# Patient Record
Sex: Female | Born: 1965 | Race: White | Hispanic: No | Marital: Married | Smoking: Never smoker
Health system: Southern US, Community
[De-identification: ages and names within clinical notes are randomized; demographics above are authoritative.]

## PROBLEM LIST (undated history)

## (undated) DIAGNOSIS — C439 Malignant melanoma of skin, unspecified: Secondary | ICD-10-CM

## (undated) DIAGNOSIS — M329 Systemic lupus erythematosus, unspecified: Secondary | ICD-10-CM

## (undated) DIAGNOSIS — C801 Malignant (primary) neoplasm, unspecified: Secondary | ICD-10-CM

## (undated) DIAGNOSIS — D6862 Lupus anticoagulant syndrome: Secondary | ICD-10-CM

## (undated) DIAGNOSIS — M3214 Glomerular disease in systemic lupus erythematosus: Secondary | ICD-10-CM

## (undated) HISTORY — PX: TONSILLECTOMY: SUR1361

---

## 2007-05-21 ENCOUNTER — Encounter: Admission: RE | Admit: 2007-05-21 | Discharge: 2007-05-21 | Payer: Self-pay | Admitting: Obstetrics and Gynecology

## 2008-06-20 ENCOUNTER — Ambulatory Visit: Payer: Self-pay | Admitting: Diagnostic Radiology

## 2008-06-20 ENCOUNTER — Ambulatory Visit (HOSPITAL_BASED_OUTPATIENT_CLINIC_OR_DEPARTMENT_OTHER): Admission: RE | Admit: 2008-06-20 | Discharge: 2008-06-20 | Payer: Self-pay | Admitting: Obstetrics and Gynecology

## 2009-08-12 ENCOUNTER — Ambulatory Visit: Payer: Self-pay | Admitting: Diagnostic Radiology

## 2009-08-12 ENCOUNTER — Ambulatory Visit (HOSPITAL_BASED_OUTPATIENT_CLINIC_OR_DEPARTMENT_OTHER): Admission: RE | Admit: 2009-08-12 | Discharge: 2009-08-12 | Payer: Self-pay | Admitting: Obstetrics and Gynecology

## 2009-08-19 ENCOUNTER — Encounter: Admission: RE | Admit: 2009-08-19 | Discharge: 2009-08-19 | Payer: Self-pay | Admitting: Obstetrics and Gynecology

## 2010-04-18 ENCOUNTER — Encounter: Payer: Self-pay | Admitting: Obstetrics and Gynecology

## 2010-09-10 ENCOUNTER — Other Ambulatory Visit (HOSPITAL_BASED_OUTPATIENT_CLINIC_OR_DEPARTMENT_OTHER): Payer: Self-pay | Admitting: Obstetrics and Gynecology

## 2010-09-10 DIAGNOSIS — Z1231 Encounter for screening mammogram for malignant neoplasm of breast: Secondary | ICD-10-CM

## 2010-09-13 ENCOUNTER — Ambulatory Visit (HOSPITAL_BASED_OUTPATIENT_CLINIC_OR_DEPARTMENT_OTHER)
Admission: RE | Admit: 2010-09-13 | Discharge: 2010-09-13 | Disposition: A | Discharge status: Home / Self Care | Payer: BC Managed Care – PPO | Source: Ambulatory Visit | Attending: Obstetrics and Gynecology | Admitting: Obstetrics and Gynecology

## 2010-09-13 DIAGNOSIS — Z1231 Encounter for screening mammogram for malignant neoplasm of breast: Secondary | ICD-10-CM | POA: Insufficient documentation

## 2011-10-04 ENCOUNTER — Other Ambulatory Visit (HOSPITAL_BASED_OUTPATIENT_CLINIC_OR_DEPARTMENT_OTHER): Payer: Self-pay | Admitting: Obstetrics and Gynecology

## 2011-10-04 DIAGNOSIS — Z1231 Encounter for screening mammogram for malignant neoplasm of breast: Secondary | ICD-10-CM

## 2011-10-05 ENCOUNTER — Ambulatory Visit (HOSPITAL_BASED_OUTPATIENT_CLINIC_OR_DEPARTMENT_OTHER)
Admission: RE | Admit: 2011-10-05 | Discharge: 2011-10-05 | Disposition: A | Discharge status: Home / Self Care | Payer: BC Managed Care – PPO | Source: Ambulatory Visit | Attending: Obstetrics and Gynecology | Admitting: Obstetrics and Gynecology

## 2011-10-05 DIAGNOSIS — Z1231 Encounter for screening mammogram for malignant neoplasm of breast: Secondary | ICD-10-CM | POA: Insufficient documentation

## 2012-10-23 ENCOUNTER — Other Ambulatory Visit (HOSPITAL_BASED_OUTPATIENT_CLINIC_OR_DEPARTMENT_OTHER): Payer: Self-pay | Admitting: Obstetrics and Gynecology

## 2012-10-23 DIAGNOSIS — Z1231 Encounter for screening mammogram for malignant neoplasm of breast: Secondary | ICD-10-CM

## 2012-10-31 ENCOUNTER — Ambulatory Visit (HOSPITAL_BASED_OUTPATIENT_CLINIC_OR_DEPARTMENT_OTHER)
Admission: RE | Admit: 2012-10-31 | Discharge: 2012-10-31 | Disposition: A | Discharge status: Home / Self Care | Payer: BC Managed Care – PPO | Source: Ambulatory Visit | Attending: Obstetrics and Gynecology | Admitting: Obstetrics and Gynecology

## 2012-10-31 DIAGNOSIS — Z1231 Encounter for screening mammogram for malignant neoplasm of breast: Secondary | ICD-10-CM | POA: Insufficient documentation

## 2014-04-07 ENCOUNTER — Other Ambulatory Visit (HOSPITAL_BASED_OUTPATIENT_CLINIC_OR_DEPARTMENT_OTHER): Payer: Self-pay | Admitting: Obstetrics and Gynecology

## 2014-04-07 DIAGNOSIS — Z1231 Encounter for screening mammogram for malignant neoplasm of breast: Secondary | ICD-10-CM

## 2014-04-08 ENCOUNTER — Ambulatory Visit (HOSPITAL_BASED_OUTPATIENT_CLINIC_OR_DEPARTMENT_OTHER)
Admission: RE | Admit: 2014-04-08 | Discharge: 2014-04-08 | Disposition: A | Discharge status: Home / Self Care | Payer: 59 | Source: Ambulatory Visit | Attending: Obstetrics and Gynecology | Admitting: Obstetrics and Gynecology

## 2014-04-08 DIAGNOSIS — Z1231 Encounter for screening mammogram for malignant neoplasm of breast: Secondary | ICD-10-CM | POA: Diagnosis present

## 2014-06-14 ENCOUNTER — Encounter (HOSPITAL_BASED_OUTPATIENT_CLINIC_OR_DEPARTMENT_OTHER): Payer: Self-pay | Admitting: *Deleted

## 2014-06-14 ENCOUNTER — Emergency Department (HOSPITAL_BASED_OUTPATIENT_CLINIC_OR_DEPARTMENT_OTHER)
Admission: EM | Admit: 2014-06-14 | Discharge: 2014-06-14 | Disposition: A | Discharge status: Home / Self Care | Payer: 59 | Attending: Emergency Medicine | Admitting: Emergency Medicine

## 2014-06-14 ENCOUNTER — Emergency Department (HOSPITAL_BASED_OUTPATIENT_CLINIC_OR_DEPARTMENT_OTHER): Payer: 59

## 2014-06-14 DIAGNOSIS — R1032 Left lower quadrant pain: Secondary | ICD-10-CM | POA: Insufficient documentation

## 2014-06-14 DIAGNOSIS — Z7901 Long term (current) use of anticoagulants: Secondary | ICD-10-CM | POA: Insufficient documentation

## 2014-06-14 DIAGNOSIS — Z85828 Personal history of other malignant neoplasm of skin: Secondary | ICD-10-CM | POA: Diagnosis not present

## 2014-06-14 DIAGNOSIS — Z79899 Other long term (current) drug therapy: Secondary | ICD-10-CM | POA: Diagnosis not present

## 2014-06-14 DIAGNOSIS — Z8739 Personal history of other diseases of the musculoskeletal system and connective tissue: Secondary | ICD-10-CM | POA: Diagnosis not present

## 2014-06-14 DIAGNOSIS — Z3202 Encounter for pregnancy test, result negative: Secondary | ICD-10-CM | POA: Insufficient documentation

## 2014-06-14 DIAGNOSIS — R109 Unspecified abdominal pain: Secondary | ICD-10-CM

## 2014-06-14 DIAGNOSIS — Z862 Personal history of diseases of the blood and blood-forming organs and certain disorders involving the immune mechanism: Secondary | ICD-10-CM | POA: Diagnosis not present

## 2014-06-14 HISTORY — DX: Lupus anticoagulant syndrome: D68.62

## 2014-06-14 HISTORY — DX: Malignant (primary) neoplasm, unspecified: C80.1

## 2014-06-14 HISTORY — DX: Systemic lupus erythematosus, unspecified: M32.9

## 2014-06-14 HISTORY — DX: Glomerular disease in systemic lupus erythematosus: M32.14

## 2014-06-14 HISTORY — DX: Malignant melanoma of skin, unspecified: C43.9

## 2014-06-14 LAB — URINALYSIS, ROUTINE W REFLEX MICROSCOPIC
Bilirubin Urine: NEGATIVE
GLUCOSE, UA: NEGATIVE mg/dL
Hgb urine dipstick: NEGATIVE
KETONES UR: NEGATIVE mg/dL
Leukocytes, UA: NEGATIVE
Nitrite: NEGATIVE
Protein, ur: 30 mg/dL — AB
SPECIFIC GRAVITY, URINE: 1.02 (ref 1.005–1.030)
Urobilinogen, UA: 1 mg/dL (ref 0.0–1.0)
pH: 7.5 (ref 5.0–8.0)

## 2014-06-14 LAB — URINE MICROSCOPIC-ADD ON

## 2014-06-14 LAB — CBC WITH DIFFERENTIAL/PLATELET
Basophils Absolute: 0 10*3/uL (ref 0.0–0.1)
Basophils Relative: 0 % (ref 0–1)
Eosinophils Absolute: 0.3 10*3/uL (ref 0.0–0.7)
Eosinophils Relative: 3 % (ref 0–5)
HCT: 45.7 % (ref 36.0–46.0)
Hemoglobin: 15.3 g/dL — ABNORMAL HIGH (ref 12.0–15.0)
Lymphocytes Relative: 27 % (ref 12–46)
Lymphs Abs: 2.6 10*3/uL (ref 0.7–4.0)
MCH: 30.3 pg (ref 26.0–34.0)
MCHC: 33.5 g/dL (ref 30.0–36.0)
MCV: 90.5 fL (ref 78.0–100.0)
Monocytes Absolute: 0.7 10*3/uL (ref 0.1–1.0)
Monocytes Relative: 7 % (ref 3–12)
Neutro Abs: 6.1 10*3/uL (ref 1.7–7.7)
Neutrophils Relative %: 63 % (ref 43–77)
Platelets: 231 10*3/uL (ref 150–400)
RBC: 5.05 MIL/uL (ref 3.87–5.11)
RDW: 13 % (ref 11.5–15.5)
WBC: 9.6 10*3/uL (ref 4.0–10.5)

## 2014-06-14 LAB — PROTIME-INR
INR: 1.81 — AB (ref 0.00–1.49)
Prothrombin Time: 21 seconds — ABNORMAL HIGH (ref 11.6–15.2)

## 2014-06-14 LAB — BASIC METABOLIC PANEL
ANION GAP: 9 (ref 5–15)
BUN: 14 mg/dL (ref 6–23)
CHLORIDE: 102 mmol/L (ref 96–112)
CO2: 26 mmol/L (ref 19–32)
Calcium: 9.5 mg/dL (ref 8.4–10.5)
Creatinine, Ser: 0.68 mg/dL (ref 0.50–1.10)
GFR calc Af Amer: >90 mL/min (ref >90)
Glucose, Bld: 160 mg/dL — ABNORMAL HIGH (ref 70–99)
Potassium: 4.1 mmol/L (ref 3.5–5.1)
SODIUM: 137 mmol/L (ref 135–145)

## 2014-06-14 LAB — PREGNANCY, URINE: PREG TEST UR: NEGATIVE

## 2014-06-14 MED ORDER — HYDROCODONE-ACETAMINOPHEN 5-325 MG PO TABS: 1.0000 | ORAL_TABLET | Freq: Four times a day (QID) | ORAL | Status: AC | PRN

## 2014-06-14 MED ORDER — ONDANSETRON HCL 4 MG/2ML IJ SOLN
INTRAMUSCULAR | Status: AC
Start: 2014-06-14 — End: 2014-06-14
  Filled 2014-06-14: qty 2

## 2014-06-14 MED ORDER — IOHEXOL 300 MG/ML  SOLN
100.0000 mL | Freq: Once | INTRAMUSCULAR | Status: AC | PRN
  Administered 2014-06-14: 100 mL via INTRAVENOUS

## 2014-06-14 MED ORDER — SODIUM CHLORIDE 0.9 % IV BOLUS (SEPSIS)
1000.0000 mL | Freq: Once | INTRAVENOUS | Status: AC
  Administered 2014-06-14: 1000 mL via INTRAVENOUS

## 2014-06-14 MED ORDER — IOHEXOL 300 MG/ML  SOLN
50.0000 mL | Freq: Once | INTRAMUSCULAR | Status: AC | PRN
  Administered 2014-06-14: 50 mL via ORAL

## 2014-06-14 MED ORDER — ONDANSETRON 4 MG PO TBDP: 4.0000 mg | ORAL_TABLET | Freq: Three times a day (TID) | ORAL | Status: AC | PRN

## 2014-06-14 MED ORDER — MORPHINE SULFATE 4 MG/ML IJ SOLN
4.0000 mg | Freq: Once | INTRAMUSCULAR | Status: AC
  Administered 2014-06-14: 4 mg via INTRAVENOUS
  Filled 2014-06-14: qty 1

## 2014-06-14 MED ORDER — ONDANSETRON HCL 4 MG/2ML IJ SOLN
4.0000 mg | Freq: Once | INTRAMUSCULAR | Status: AC
  Administered 2014-06-14: 4 mg via INTRAVENOUS

## 2014-06-14 NOTE — ED Notes (Signed)
Hip and LLQ abd pain since 06/02/14- Seen by her GYN yesterday and full exam and [place]- scheduled for CT scan on Tuesday- states pain is now radiating into back and hip- ambulates with limp- statespain too great to wait for CT Tuesday- denies n/v/d

## 2014-06-14 NOTE — ED Provider Notes (Signed)
CSN: [id number]     Arrival date & time 06/14/14  1135 History   First MD Initiated Contact with Patient 06/14/14 1205     Chief Complaint  Patient presents with  . Flank Pain     (Consider location/radiation/quality/duration/timing/severity/associated sxs/prior Treatment) HPI Comments: Patient is a 49 yo F PMHx significant for SLE, Lupus nephritis, Lupus anticoagulant disorder presenting to the ED for evaluation of worsening left flank pain with radiation to LLQ over the last day since being seen by her Ob/Gyn yesterday. Patient states that her pain initially started on the seventh. She states she had a pelvic ultrasound and pelvic exam which revealed small fibroids, she was due to have a CT scan of her abdomen with contrast on Tuesday the due to her increased pain and inability to get comfortable or sleep she came to the emergency department for sooner evaluation. No modifying factors identified. Denies any fevers, chills, nausea, vomiting, chest pain, shortness of breath, urinary or bowel concerns.  Patient is a 49 y.o. female presenting with flank pain.  Flank Pain Associated symptoms include abdominal pain. Pertinent negatives include no nausea or vomiting.    Past Medical History  Diagnosis Date  . Cancer   . Melanoma   . SLE (systemic lupus erythematosus)   . Lupus anticoagulant disorder   . Lupus nephritis    Past Surgical History  Procedure Laterality Date  . Tonsillectomy     No family history on file. History  Substance Use Topics  . Smoking status: Never Smoker   . Smokeless tobacco: Never Used  . Alcohol Use: No   OB History    No data available     Review of Systems  Gastrointestinal: Positive for abdominal pain. Negative for nausea and vomiting.  Genitourinary: Positive for flank pain. Negative for decreased urine volume, vaginal bleeding, vaginal discharge and vaginal pain.  All other systems reviewed and are negative.     Allergies  Review of  patient's allergies indicates no known allergies.  Home Medications   Prior to Admission medications   Medication Sig Start Date End Date Taking? Authorizing Provider  citalopram (CELEXA) 10 MG tablet Take 10 mg by mouth daily.   Yes Historical Provider, MD  fexofenadine (ALLEGRA) 180 MG tablet Take 180 mg by mouth daily.   Yes Historical Provider, MD  lisinopril (PRINIVIL,ZESTRIL) 2.5 MG tablet Take 2.5 mg by mouth daily. For kidneys   Yes Historical Provider, MD  warfarin (COUMADIN) 4 MG tablet Take 4 mg by mouth daily. 4mg  Mon- Sat; 6mg  on sunday   Yes Historical Provider, MD  HYDROcodone-acetaminophen (NORCO) 5-325 MG per tablet Take 1-2 tablets by mouth every 6 (six) hours as needed for severe pain. 06/14/14   Markelle Asaro, PA-C  ondansetron (ZOFRAN ODT) 4 MG disintegrating tablet Take 1 tablet (4 mg total) by mouth every 8 (eight) hours as needed for nausea. 06/14/14   Zoelle Markus, PA-C   BP 112/61 mmHg  Pulse 69  Temp(Src) 97.7 F (36.5 C) (Oral)  Resp 18  Ht 5\' 6"  (1.676 m)  Wt 201 lb (91.173 kg)  BMI 32.46 kg/m2  SpO2 100% Physical Exam  Constitutional: She is oriented to person, place, and time. She appears well-developed and well-nourished. No distress.  Patient pacing uncomfortably in the room.   HENT:  Head: Normocephalic and atraumatic.  Right Ear: External ear normal.  Left Ear: External ear normal.  Nose: Nose normal.  Mouth/Throat: Oropharynx is clear and moist.  Eyes: Conjunctivae are normal.  Neck: Normal range of motion. Neck supple.  Cardiovascular: Normal rate, regular rhythm and normal heart sounds.   Pulmonary/Chest: Effort normal and breath sounds normal.  Abdominal: Soft. Bowel sounds are normal. She exhibits no distension. There is tenderness in the left lower quadrant. There is no rigidity, no rebound and no guarding.    Musculoskeletal: Normal range of motion.       Back:  Neurological: She is alert and oriented to person, place,  and time.  Skin: Skin is warm and dry. She is not diaphoretic.  Psychiatric: She has a normal mood and affect.  Nursing note and vitals reviewed.   ED Course  Procedures (including critical care time) Medications  morphine 4 MG/ML injection 4 mg (4 mg Intravenous Given 06/14/14 1302)  sodium chloride 0.9 % bolus 1,000 mL (0 mLs Intravenous Stopped 06/14/14 1339)  iohexol (OMNIPAQUE) 300 MG/ML solution 50 mL (50 mLs Oral Contrast Given 06/14/14 1255)  ondansetron (ZOFRAN) injection 4 mg (4 mg Intravenous Given 06/14/14 1335)  ondansetron (ZOFRAN) 4 MG/2ML injection (  Duplicate 0/25/85 2778)  iohexol (OMNIPAQUE) 300 MG/ML solution 100 mL (100 mLs Intravenous Contrast Given 06/14/14 1350)    Labs Review Labs Reviewed  URINALYSIS, ROUTINE W REFLEX MICROSCOPIC - Abnormal; Notable for the following:    APPearance CLOUDY (*)    Protein, ur 30 (*)    All other components within normal limits  URINE MICROSCOPIC-ADD ON - Abnormal; Notable for the following:    Squamous Epithelial / LPF FEW (*)    Bacteria, UA MANY (*)    All other components within normal limits  BASIC METABOLIC PANEL - Abnormal; Notable for the following:    Glucose, Bld 160 (*)    All other components within normal limits  CBC WITH DIFFERENTIAL/PLATELET - Abnormal; Notable for the following:    Hemoglobin 15.3 (*)    All other components within normal limits  PROTIME-INR - Abnormal; Notable for the following:    Prothrombin Time 21.0 (*)    INR 1.81 (*)    All other components within normal limits  PREGNANCY, URINE    Imaging Review Ct Abdomen Pelvis W Contrast  06/14/2014   CLINICAL DATA:  Lt groin, lt lower abd pain radiating around to back since march 7. Hx ivc filter 9yrs ago for pe.  EXAM: CT ABDOMEN AND PELVIS WITH CONTRAST  TECHNIQUE: Multidetector CT imaging of the abdomen and pelvis was performed using the standard protocol following bolus administration of intravenous contrast.  CONTRAST:  12mL OMNIPAQUE  IOHEXOL 300 MG/ML SOLN, 58mL OMNIPAQUE IOHEXOL 300 MG/ML SOLN  COMPARISON:  Abdominal ultrasound, 01/27/2014  FINDINGS: Coarse reticular and mild patchy opacity in the dependent lower lobes, left greater than right, most likely atelectasis. Heart is normal size.  Liver is borderline enlarged. There is diffuse fatty infiltration. No liver mass or focal lesion.  Normal spleen, gallbladder, pancreas and adrenal glands.  13 mm midpole left renal cyst. No other renal masses, no stones and no hydronephrosis. Normal ureters and bladder.  Uterus and adnexa are unremarkable.  No pathologically enlarged lymph nodes. No abnormal fluid collections. Inferior vena cava filter is well positioned with its superior tip just below the right renal artery.  There are scattered colonic diverticula mostly along the sigmoid. No diverticulitis. No other colonic abnormality. Normal appendix and small bowel.  No significant bony abnormality.  IMPRESSION: 1. No acute findings. No findings to explain left lower abdomen to left groin pain. 2. There are colonic diverticula mostly along sigmoid  without evidence diverticulitis. 3. Borderline hepatomegaly.  Diffuse hepatic steatosis.   Electronically Signed   By: Lajean Manes M.D.   On: 06/14/2014 14:18     EKG Interpretation None      MDM   Final diagnoses:  Abdominal pain in female  Abdominal pain in female    Filed Vitals:   06/14/14 1433  BP: 112/61  Pulse: 69  Temp: 97.7 F (36.5 C)  Resp: 18   Afebrile, NAD, non-toxic appearing, AAOx4.  I have reviewed nursing notes, vital signs, and all appropriate lab and imaging results for this patient. Patient is nontoxic, nonseptic appearing, in no apparent distress.  Patient's pain and other symptoms adequately managed in emergency department.  Fluid bolus given.  Labs, imaging and vitals reviewed.  Patient does not meet the SIRS or Sepsis criteria.  On repeat exam patient does not have a surgical abdomin and there are no  peritoneal signs.  No indication of appendicitis, bowel obstruction, bowel perforation, cholecystitis, diverticulitis, PID or ectopic pregnancy.  Patient discharged home with symptomatic treatment and given strict instructions for follow-up with their primary care physician.  I have also discussed reasons to return immediately to the ER.  Patient expresses understanding and agrees with plan. Patient is stable at time of discharge     Baron Sane, PA-C 06/14/14 [place], MD 06/14/14 2039

## 2014-06-14 NOTE — ED Notes (Signed)
Pt states "nagging throbbing" suprapubic/lower abdominal pain (bilateral) since march 7.  Pt seen by obgyn yesterday and had vaginal and external ultrasound performed, with resulting dx of "very small fibroids."  Per pt, obgyn stated that fibroids not likely cause of her pain and that she follow up with a CT scan, scheduled for Tuesday.  Past 2 night, pt unable to get comfortable to sleep.  Pt denies any urinary or bowel concerns or symptoms, but states that pain now radiates from suprapubic region around to bilateral flanks, worse on left side.

## 2014-06-14 NOTE — Discharge Instructions (Signed)
Please follow up with your primary care physician in 1-2 days. If you do not have one please call the [place] number listed above. Please take pain medication and/or muscle relaxants as prescribed and as needed for pain. Please do not drive on narcotic pain medication or on muscle relaxants. Please read all discharge instructions and return precautions.    Abdominal Pain, Women Abdominal (stomach, pelvic, or belly) pain can be caused by many things. It is important to tell your doctor:  The location of the pain.  Does it come and go or is it present all the time?  Are there things that start the pain (eating certain foods, exercise)?  Are there other symptoms associated with the pain (fever, nausea, vomiting, diarrhea)? All of this is helpful to know when trying to find the cause of the pain. CAUSES   Stomach: virus or bacteria infection, or ulcer.  Intestine: appendicitis (inflamed appendix), regional ileitis (Crohn's disease), ulcerative colitis (inflamed colon), irritable bowel syndrome, diverticulitis (inflamed diverticulum of the colon), or cancer of the stomach or intestine.  Gallbladder disease or stones in the gallbladder.  Kidney disease, kidney stones, or infection.  Pancreas infection or cancer.  Fibromyalgia (pain disorder).  Diseases of the female organs:  Uterus: fibroid (non-cancerous) tumors or infection.  Fallopian tubes: infection or tubal pregnancy.  Ovary: cysts or tumors.  Pelvic adhesions (scar tissue).  Endometriosis (uterus lining tissue growing in the pelvis and on the pelvic organs).  Pelvic congestion syndrome (female organs filling up with blood just before the menstrual period).  Pain with the menstrual period.  Pain with ovulation (producing an egg).  Pain with an IUD (intrauterine device, birth control) in the uterus.  Cancer of the female organs.  Functional pain (pain not caused by a disease, may improve  without treatment).  Psychological pain.  Depression. DIAGNOSIS  Your doctor will decide the seriousness of your pain by doing an examination.  Blood tests.  X-rays.  Ultrasound.  CT scan (computed tomography, special type of X-ray).  MRI (magnetic resonance imaging).  Cultures, for infection.  Barium enema (dye inserted in the large intestine, to better view it with X-rays).  Colonoscopy (looking in intestine with a lighted tube).  Laparoscopy (minor surgery, looking in abdomen with a lighted tube).  Major abdominal exploratory surgery (looking in abdomen with a large incision). TREATMENT  The treatment will depend on the cause of the pain.   Many cases can be observed and treated at home.  Over-the-counter medicines recommended by your caregiver.  Prescription medicine.  Antibiotics, for infection.  Birth control pills, for painful periods or for ovulation pain.  Hormone treatment, for endometriosis.  Nerve blocking injections.  Physical therapy.  Antidepressants.  Counseling with a psychologist or psychiatrist.  Minor or major surgery. HOME CARE INSTRUCTIONS   Do not take laxatives, unless directed by your caregiver.  Take over-the-counter pain medicine only if ordered by your caregiver. Do not take aspirin because it can cause an upset stomach or bleeding.  Try a clear liquid diet (broth or water) as ordered by your caregiver. Slowly move to a bland diet, as tolerated, if the pain is related to the stomach or intestine.  Have a thermometer and take your temperature several times a day, and record it.  Bed rest and sleep, if it helps the pain.  Avoid sexual intercourse, if it causes pain.  Avoid stressful situations.  Keep your follow-up appointments and tests, as your caregiver orders.  If  the pain does not go away with medicine or surgery, you may try:  Acupuncture.  Relaxation exercises (yoga, meditation).  Group  therapy.  Counseling. SEEK MEDICAL CARE IF:   You notice certain foods cause stomach pain.  Your home care treatment is not helping your pain.  You need stronger pain medicine.  You want your IUD removed.  You feel faint or lightheaded.  You develop nausea and vomiting.  You develop a rash.  You are having side effects or an allergy to your medicine. SEEK IMMEDIATE MEDICAL CARE IF:   Your pain does not go away or gets worse.  You have a fever.  Your pain is felt only in portions of the abdomen. The right side could possibly be appendicitis. The left lower portion of the abdomen could be colitis or diverticulitis.  You are passing blood in your stools (bright red or black tarry stools, with or without vomiting).  You have blood in your urine.  You develop chills, with or without a fever.  You pass out. MAKE SURE YOU:   Understand these instructions.  Will watch your condition.  Will get help right away if you are not doing well or get worse. Document Released: 01/09/2007 Document Revised: 07/29/2013 Document Reviewed: 01/29/2009 [facility] Patient Information 2015 [place], [place]. This information is not intended to replace advice given to you by your health care provider. Make sure you discuss any questions you have with your health care provider.

## 2015-04-23 ENCOUNTER — Other Ambulatory Visit (HOSPITAL_BASED_OUTPATIENT_CLINIC_OR_DEPARTMENT_OTHER): Payer: Self-pay | Admitting: Obstetrics and Gynecology

## 2015-04-23 DIAGNOSIS — Z1231 Encounter for screening mammogram for malignant neoplasm of breast: Secondary | ICD-10-CM

## 2015-04-28 ENCOUNTER — Ambulatory Visit (HOSPITAL_BASED_OUTPATIENT_CLINIC_OR_DEPARTMENT_OTHER)
Admission: RE | Admit: 2015-04-28 | Discharge: 2015-04-28 | Disposition: A | Discharge status: Home / Self Care | Payer: 59 | Source: Ambulatory Visit | Attending: Obstetrics and Gynecology | Admitting: Obstetrics and Gynecology

## 2015-04-28 DIAGNOSIS — Z1231 Encounter for screening mammogram for malignant neoplasm of breast: Secondary | ICD-10-CM | POA: Insufficient documentation

## 2016-05-17 ENCOUNTER — Other Ambulatory Visit (HOSPITAL_BASED_OUTPATIENT_CLINIC_OR_DEPARTMENT_OTHER): Payer: Self-pay | Admitting: Obstetrics and Gynecology

## 2016-05-17 DIAGNOSIS — Z1231 Encounter for screening mammogram for malignant neoplasm of breast: Secondary | ICD-10-CM

## 2016-05-24 ENCOUNTER — Ambulatory Visit (HOSPITAL_BASED_OUTPATIENT_CLINIC_OR_DEPARTMENT_OTHER)
Admission: RE | Admit: 2016-05-24 | Discharge: 2016-05-24 | Disposition: A | Discharge status: Home / Self Care | Payer: 59 | Source: Ambulatory Visit | Attending: Obstetrics and Gynecology | Admitting: Obstetrics and Gynecology

## 2016-05-24 ENCOUNTER — Other Ambulatory Visit (HOSPITAL_BASED_OUTPATIENT_CLINIC_OR_DEPARTMENT_OTHER): Payer: Self-pay | Admitting: Obstetrics and Gynecology

## 2016-05-24 DIAGNOSIS — Z1231 Encounter for screening mammogram for malignant neoplasm of breast: Secondary | ICD-10-CM

## 2017-05-03 ENCOUNTER — Other Ambulatory Visit (HOSPITAL_BASED_OUTPATIENT_CLINIC_OR_DEPARTMENT_OTHER): Payer: Self-pay | Admitting: Emergency Medicine

## 2017-05-03 DIAGNOSIS — Z1231 Encounter for screening mammogram for malignant neoplasm of breast: Secondary | ICD-10-CM

## 2017-05-27 ENCOUNTER — Inpatient Hospital Stay (HOSPITAL_BASED_OUTPATIENT_CLINIC_OR_DEPARTMENT_OTHER): Admission: RE | Admit: 2017-05-27 | Payer: 59 | Source: Ambulatory Visit

## 2017-06-03 ENCOUNTER — Encounter (HOSPITAL_BASED_OUTPATIENT_CLINIC_OR_DEPARTMENT_OTHER): Payer: Self-pay

## 2017-06-03 ENCOUNTER — Ambulatory Visit (HOSPITAL_BASED_OUTPATIENT_CLINIC_OR_DEPARTMENT_OTHER)
Admission: RE | Admit: 2017-06-03 | Discharge: 2017-06-03 | Disposition: A | Discharge status: Home / Self Care | Payer: 59 | Source: Ambulatory Visit | Attending: Emergency Medicine | Admitting: Emergency Medicine

## 2017-06-03 DIAGNOSIS — Z1231 Encounter for screening mammogram for malignant neoplasm of breast: Secondary | ICD-10-CM | POA: Diagnosis not present

## 2018-09-12 ENCOUNTER — Other Ambulatory Visit (HOSPITAL_BASED_OUTPATIENT_CLINIC_OR_DEPARTMENT_OTHER): Payer: Self-pay | Admitting: Obstetrics and Gynecology

## 2018-09-12 DIAGNOSIS — Z1231 Encounter for screening mammogram for malignant neoplasm of breast: Secondary | ICD-10-CM

## 2018-09-17 ENCOUNTER — Ambulatory Visit (HOSPITAL_BASED_OUTPATIENT_CLINIC_OR_DEPARTMENT_OTHER)
Admission: RE | Admit: 2018-09-17 | Discharge: 2018-09-17 | Disposition: A | Discharge status: Home / Self Care | Payer: 59 | Source: Ambulatory Visit | Attending: Obstetrics and Gynecology | Admitting: Obstetrics and Gynecology

## 2018-09-17 ENCOUNTER — Encounter (HOSPITAL_BASED_OUTPATIENT_CLINIC_OR_DEPARTMENT_OTHER): Payer: Self-pay

## 2018-09-17 ENCOUNTER — Other Ambulatory Visit: Payer: Self-pay

## 2018-09-17 DIAGNOSIS — Z1231 Encounter for screening mammogram for malignant neoplasm of breast: Secondary | ICD-10-CM | POA: Insufficient documentation

## 2019-09-09 ENCOUNTER — Other Ambulatory Visit (HOSPITAL_BASED_OUTPATIENT_CLINIC_OR_DEPARTMENT_OTHER): Payer: Self-pay | Admitting: Obstetrics and Gynecology

## 2019-09-09 DIAGNOSIS — Z1231 Encounter for screening mammogram for malignant neoplasm of breast: Secondary | ICD-10-CM

## 2019-09-18 ENCOUNTER — Other Ambulatory Visit: Payer: Self-pay

## 2019-09-18 ENCOUNTER — Ambulatory Visit (HOSPITAL_BASED_OUTPATIENT_CLINIC_OR_DEPARTMENT_OTHER)
Admission: RE | Admit: 2019-09-18 | Discharge: 2019-09-18 | Disposition: A | Discharge status: Home / Self Care | Payer: 59 | Source: Ambulatory Visit | Attending: Obstetrics and Gynecology | Admitting: Obstetrics and Gynecology

## 2019-09-18 DIAGNOSIS — Z1231 Encounter for screening mammogram for malignant neoplasm of breast: Secondary | ICD-10-CM | POA: Insufficient documentation

## 2020-09-02 ENCOUNTER — Other Ambulatory Visit (HOSPITAL_BASED_OUTPATIENT_CLINIC_OR_DEPARTMENT_OTHER): Payer: Self-pay | Admitting: Obstetrics and Gynecology

## 2020-09-02 DIAGNOSIS — Z1231 Encounter for screening mammogram for malignant neoplasm of breast: Secondary | ICD-10-CM

## 2020-10-05 ENCOUNTER — Encounter (HOSPITAL_BASED_OUTPATIENT_CLINIC_OR_DEPARTMENT_OTHER): Payer: Self-pay

## 2020-10-05 ENCOUNTER — Ambulatory Visit (HOSPITAL_BASED_OUTPATIENT_CLINIC_OR_DEPARTMENT_OTHER)
Admission: RE | Admit: 2020-10-05 | Discharge: 2020-10-05 | Disposition: A | Discharge status: Home / Self Care | Payer: BLUE CROSS/BLUE SHIELD | Source: Ambulatory Visit | Attending: Obstetrics and Gynecology | Admitting: Obstetrics and Gynecology

## 2020-10-05 ENCOUNTER — Other Ambulatory Visit: Payer: Self-pay

## 2020-10-05 DIAGNOSIS — Z1231 Encounter for screening mammogram for malignant neoplasm of breast: Secondary | ICD-10-CM | POA: Diagnosis not present

## 2021-10-05 ENCOUNTER — Other Ambulatory Visit (HOSPITAL_BASED_OUTPATIENT_CLINIC_OR_DEPARTMENT_OTHER): Payer: Self-pay | Admitting: Obstetrics and Gynecology

## 2021-10-05 DIAGNOSIS — Z1231 Encounter for screening mammogram for malignant neoplasm of breast: Secondary | ICD-10-CM

## 2021-10-12 ENCOUNTER — Ambulatory Visit (HOSPITAL_BASED_OUTPATIENT_CLINIC_OR_DEPARTMENT_OTHER)
Admission: RE | Admit: 2021-10-12 | Discharge: 2021-10-12 | Disposition: A | Discharge status: Home / Self Care | Payer: 59 | Source: Ambulatory Visit | Attending: Obstetrics and Gynecology | Admitting: Obstetrics and Gynecology

## 2021-10-12 ENCOUNTER — Encounter (HOSPITAL_BASED_OUTPATIENT_CLINIC_OR_DEPARTMENT_OTHER): Payer: Self-pay

## 2021-10-12 DIAGNOSIS — Z1231 Encounter for screening mammogram for malignant neoplasm of breast: Secondary | ICD-10-CM | POA: Insufficient documentation

## 2022-04-13 IMAGING — MG MM DIGITAL SCREENING BILAT W/ TOMO AND CAD
8 series · 8 of 24 positions shown · non-contrast
Comparison: Previous exam(s).

CLINICAL DATA: Screening.

EXAM:
DIGITAL SCREENING BILATERAL MAMMOGRAM WITH TOMOSYNTHESIS AND CAD
TECHNIQUE: Bilateral screening digital craniocaudal and mediolateral oblique
mammograms were obtained. Bilateral screening digital breast
tomosynthesis was performed. The images were evaluated with
computer-aided detection.

[R CC synth-2D]
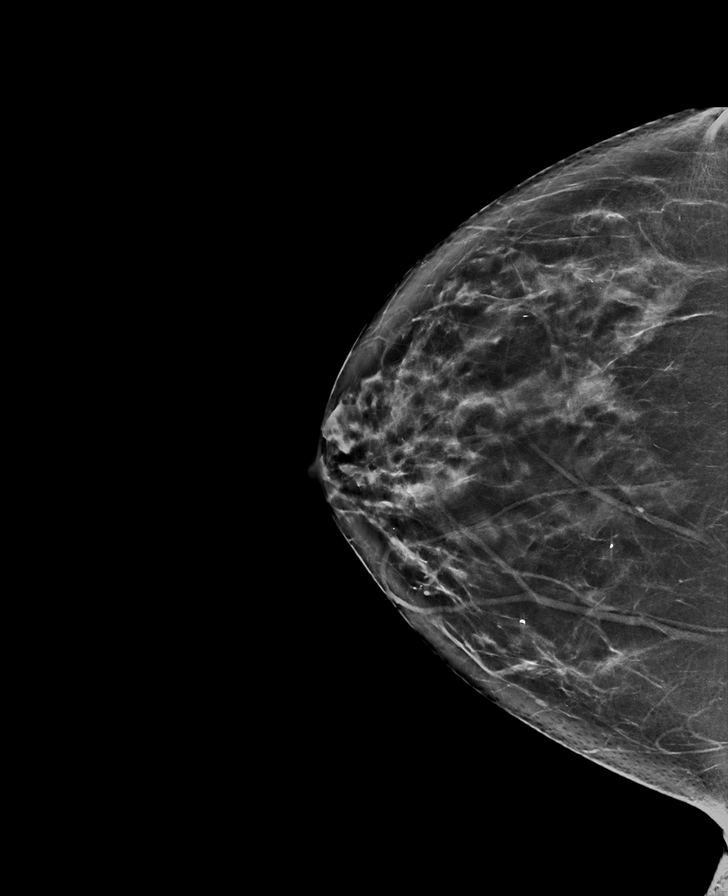

[L CC synth-2D]
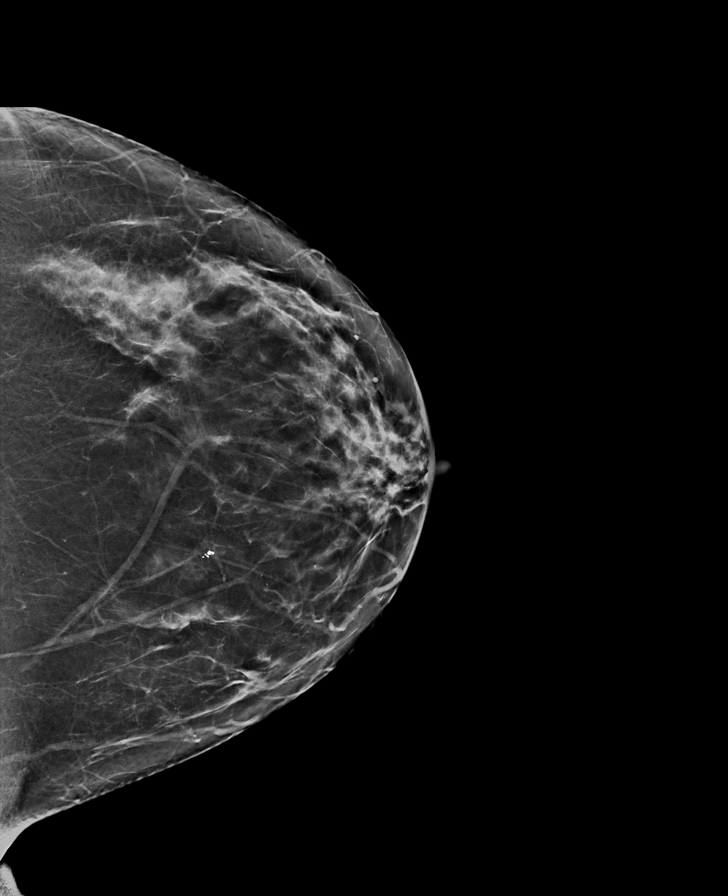

[R MLO synth-2D]
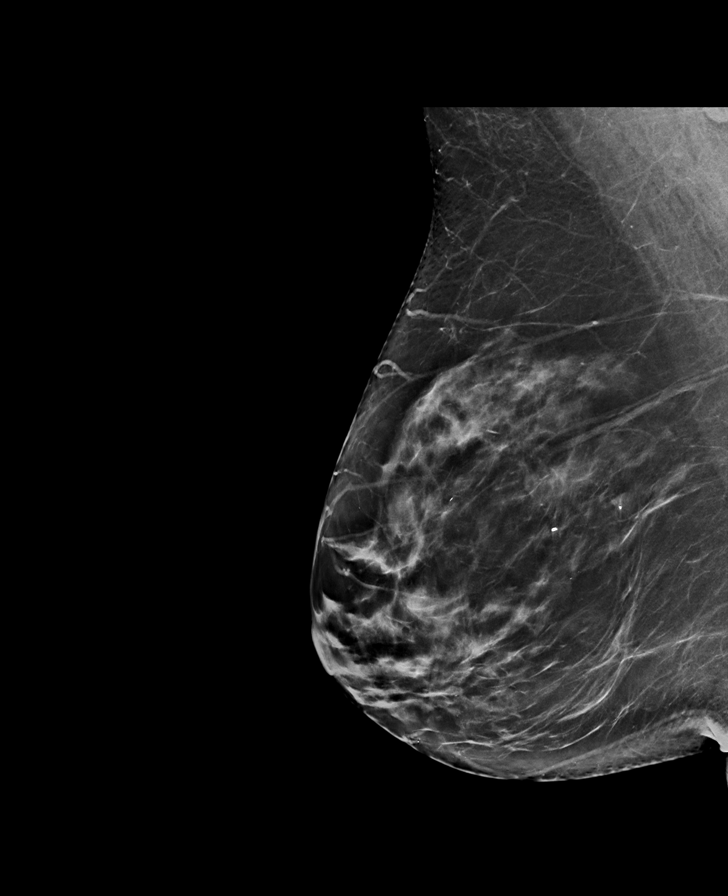

[L MLO synth-2D]
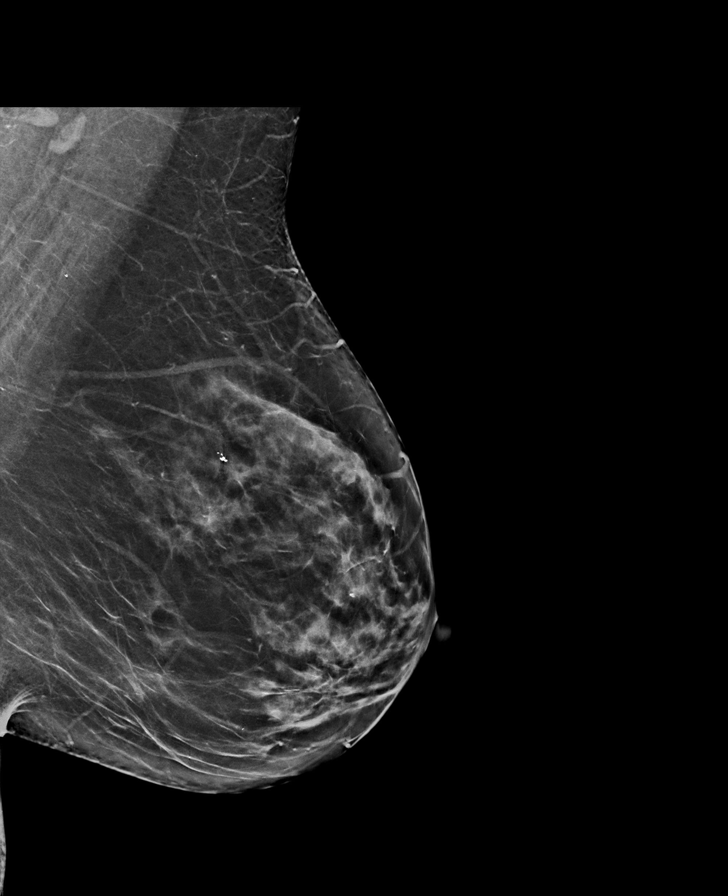

[L MLO tomo · tomo slice 39/76.0]
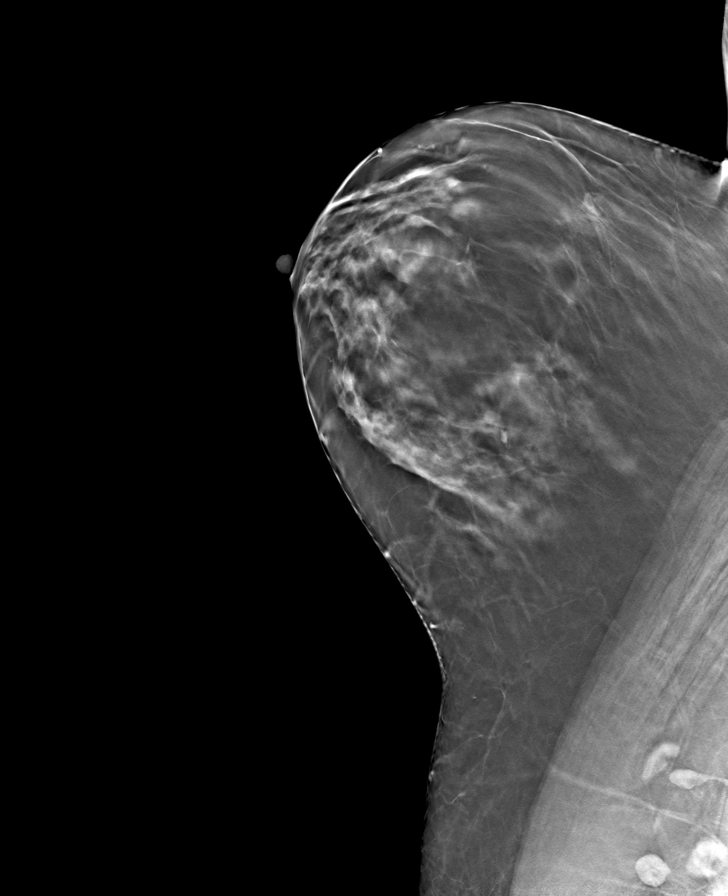

[R MLO tomo · tomo slice 39/78.0]
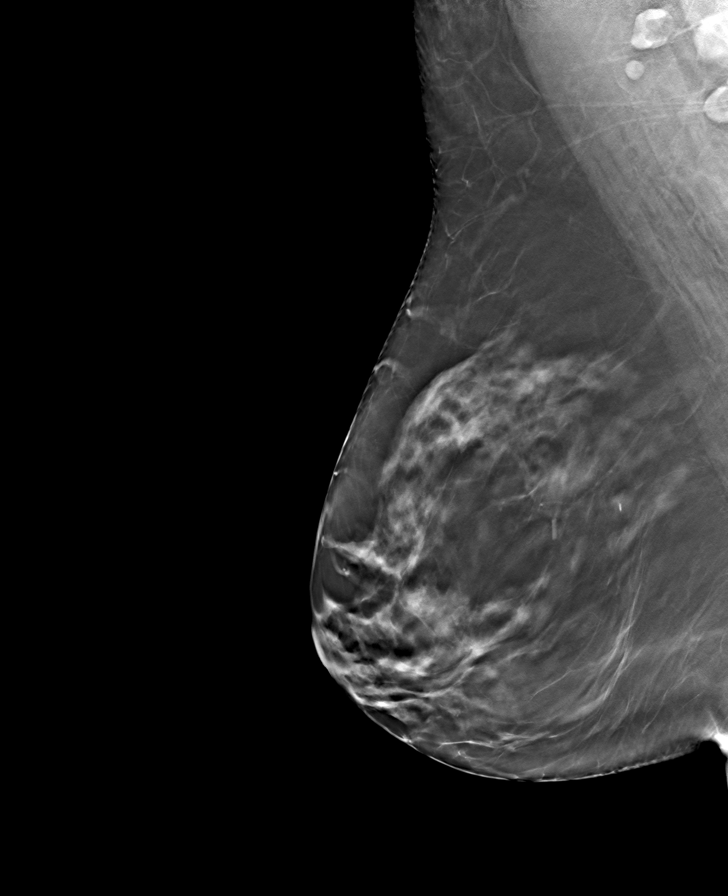

[R CC tomo · tomo slice 37/72.0]
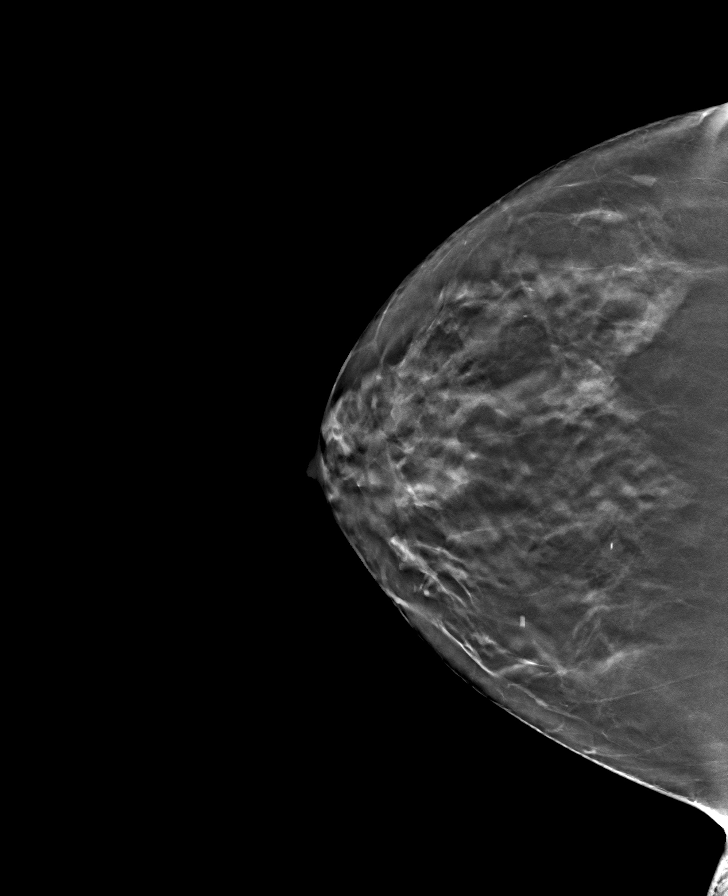

[L CC tomo · tomo slice 35/69.0]
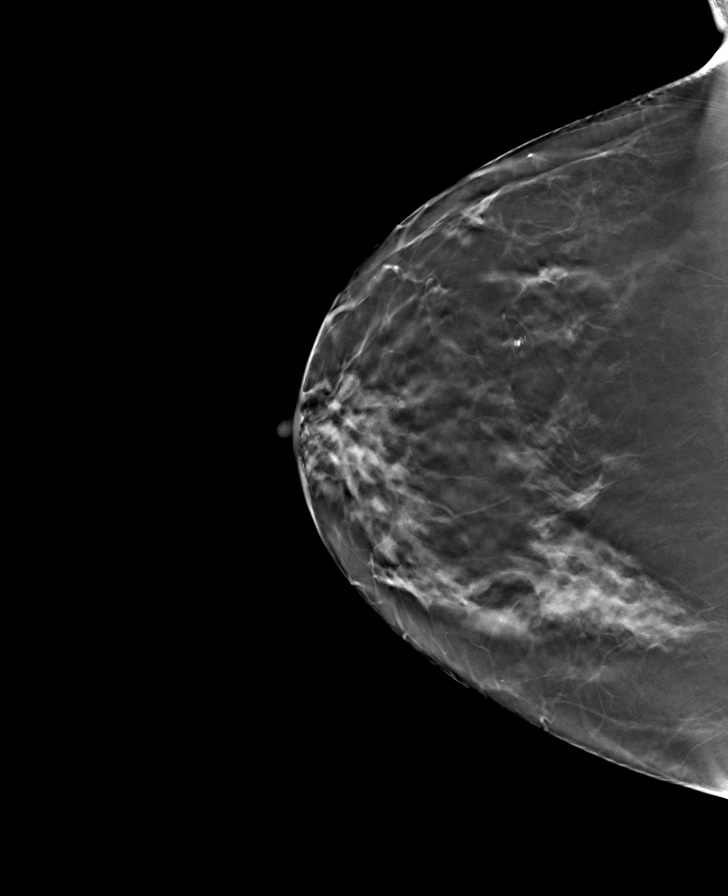

[8 of 24 positions shown; findings below may reference images not displayed]

ACR Breast Density Category c: The breast tissue is heterogeneously
dense, which may obscure small masses.
FINDINGS: There are no findings suspicious for malignancy.
IMPRESSION: No mammographic evidence of malignancy. A result letter of this
screening mammogram will be mailed directly to the patient.

RECOMMENDATION:
Screening mammogram in one year. (Code:Q3-W-BC3)

BI-RADS CATEGORY  1: Negative.

## 2022-09-19 ENCOUNTER — Other Ambulatory Visit: Payer: Self-pay

## 2022-09-19 DIAGNOSIS — Z1231 Encounter for screening mammogram for malignant neoplasm of breast: Secondary | ICD-10-CM

## 2022-10-26 ENCOUNTER — Other Ambulatory Visit (HOSPITAL_BASED_OUTPATIENT_CLINIC_OR_DEPARTMENT_OTHER): Payer: Self-pay | Admitting: Obstetrics and Gynecology

## 2022-10-26 DIAGNOSIS — Z1231 Encounter for screening mammogram for malignant neoplasm of breast: Secondary | ICD-10-CM

## 2022-10-31 ENCOUNTER — Ambulatory Visit (HOSPITAL_BASED_OUTPATIENT_CLINIC_OR_DEPARTMENT_OTHER)
Admission: RE | Admit: 2022-10-31 | Discharge: 2022-10-31 | Disposition: A | Discharge status: Home / Self Care | Payer: 59 | Source: Ambulatory Visit | Attending: Obstetrics and Gynecology | Admitting: Obstetrics and Gynecology

## 2022-10-31 ENCOUNTER — Encounter (HOSPITAL_BASED_OUTPATIENT_CLINIC_OR_DEPARTMENT_OTHER): Payer: Self-pay

## 2022-10-31 DIAGNOSIS — Z1231 Encounter for screening mammogram for malignant neoplasm of breast: Secondary | ICD-10-CM | POA: Insufficient documentation

## 2023-10-26 ENCOUNTER — Other Ambulatory Visit (HOSPITAL_BASED_OUTPATIENT_CLINIC_OR_DEPARTMENT_OTHER): Payer: Self-pay | Admitting: Obstetrics and Gynecology

## 2023-10-26 DIAGNOSIS — Z1231 Encounter for screening mammogram for malignant neoplasm of breast: Secondary | ICD-10-CM

## 2023-11-13 ENCOUNTER — Encounter (HOSPITAL_BASED_OUTPATIENT_CLINIC_OR_DEPARTMENT_OTHER): Payer: Self-pay

## 2023-11-13 ENCOUNTER — Ambulatory Visit (HOSPITAL_BASED_OUTPATIENT_CLINIC_OR_DEPARTMENT_OTHER)
Admission: RE | Admit: 2023-11-13 | Discharge: 2023-11-13 | Disposition: A | Discharge status: Home / Self Care | Source: Ambulatory Visit | Attending: Obstetrics and Gynecology | Admitting: Obstetrics and Gynecology

## 2023-11-13 DIAGNOSIS — Z1231 Encounter for screening mammogram for malignant neoplasm of breast: Secondary | ICD-10-CM | POA: Insufficient documentation
